# Patient Record
Sex: Male | Born: 1970 | Race: Asian | Hispanic: No | Marital: Married | Smoking: Never smoker
Health system: Southern US, Community
[De-identification: ages and names within clinical notes are randomized; demographics above are authoritative.]

## PROBLEM LIST (undated history)

## (undated) DIAGNOSIS — IMO0002 Reserved for concepts with insufficient information to code with codable children: Secondary | ICD-10-CM

## (undated) DIAGNOSIS — M1990 Unspecified osteoarthritis, unspecified site: Secondary | ICD-10-CM

## (undated) HISTORY — PX: EYE SURGERY: SHX253

## (undated) HISTORY — DX: Reserved for concepts with insufficient information to code with codable children: IMO0002

## (undated) HISTORY — DX: Unspecified osteoarthritis, unspecified site: M19.90

## (undated) HISTORY — PX: VASECTOMY: SHX75

---

## 2003-05-21 ENCOUNTER — Encounter: Admission: RE | Admit: 2003-05-21 | Discharge: 2003-06-25 | Payer: Self-pay | Admitting: Internal Medicine

## 2012-08-19 ENCOUNTER — Ambulatory Visit: Payer: BC Managed Care – PPO

## 2012-08-19 ENCOUNTER — Ambulatory Visit: Payer: BC Managed Care – PPO | Admitting: Family Medicine

## 2012-08-19 VITALS — BP 130/78 | HR 85 | Temp 98.2°F | Resp 16 | Ht 71.0 in | Wt 182.0 lb

## 2012-08-19 DIAGNOSIS — M546 Pain in thoracic spine: Secondary | ICD-10-CM

## 2012-08-19 DIAGNOSIS — E7800 Pure hypercholesterolemia, unspecified: Secondary | ICD-10-CM

## 2012-08-19 LAB — POCT GLYCOSYLATED HEMOGLOBIN (HGB A1C): Hemoglobin A1C: 5.6

## 2012-08-19 NOTE — Progress Notes (Signed)
[facility] [address], [place] [address] [phone]  Date:  08/19/2012   Name:  Chris Byrd   DOB:  12-May-1971   MRN:  [id number]  PCP:  No primary provider on file.    Chief Complaint: Back Pain   History of Present Illness:  Chris Byrd is a 41 y.o. very pleasant male patient who presents with the following:  He is here today to evaluate back pain- he has noted some discomfort for about 3 months. The pain is located in his thoracic area, across the muscles of his back. The pain has not been major- at one point he was taking ibuprofen 3 times a day, but has not done that for the last couple of months.  He currently needs medication just occasionally.    He feels discomfort now mostly with getting out of bed in the am- his back is stiff and sore.  He had a similar problem 8- 10 years ago and did PT which did help.   He does not have any symptoms into his legs, no pain or numbness.  No leg weakness There was no particular inciting injury that he can recall.    He is concerned about arthritis in his neck- it seems we did x-rays a few years ago. (however I cannot actually find any record of x-rays so these may not have been done.)  He sometimes will have some pain or stiffness in his neck as well.    He also had a cholesterol check at his office a month ago- he was fasting and his LDL was 170.  He does not smoke, no DM, no HTN.  His father has a history of DM and high cholesterol.  He would like to have an A1c today as well. He has been working on getting more exercise since he received this LDL report  There is no problem list on file for this patient.   Past Medical History  Diagnosis Date  . Arthritis   . Ulcer     Past Surgical History  Procedure Date  . Eye surgery   . Vasectomy     History  Substance Use Topics  . Smoking status: Never Smoker   . Smokeless tobacco: Not on file  . Alcohol Use: No    Family History  Problem  Relation Age of Onset  . Diabetes Father   . Cancer Maternal Grandmother     Allergies  Allergen Reactions  . Reglan (Metoclopramide)     Medication list has been reviewed and updated.  No current outpatient prescriptions on file prior to visit.    Review of Systems:  As per HPI- otherwise negative.   Physical Examination: Filed Vitals:   08/19/12 1316  BP: 130/78  Pulse: 85  Temp: 98.2 F (36.8 C)  Resp: 16   Filed Vitals:   08/19/12 1316  Height: 5\' 11"  (1.803 m)  Weight: 182 lb (82.555 kg)   Body mass index is 25.38 kg/(m^2). Ideal Body Weight: Weight in (lb) to have BMI = 25: 178.9   GEN: WDWN, NAD, Non-toxic, A & O x 3 HEENT: Atraumatic, Normocephalic. Neck supple. No masses, No LAD. Bilateral TM wnl, oropharynx normal.  PEERL,EOMI.   Ears and Nose: No external deformity. CV: RRR, No M/G/R. No JVD. No thrill. No extra heart sounds. PULM: CTA B, no wheezes, crackles, rhonchi. No retractions. No resp. distress. No accessory muscle use. Back: he is slightly tender over the thoracic paraspinous muscles.  EXTR: No c/c/e NEURO Normal gait.  PSYCH: Normally interactive. Conversant. Not depressed or anxious appearing.  Calm demeanor.   [facility] reading (PRIMARY) by  Dr. Patsy Lager. Thoracic spine and AP chest: negative for any acute abnormalities.  He does have degenerative changes of the T spine  THORACIC SPINE - 2 VIEW  Comparison: None.  Findings: Vertebral body height and alignment are normal. No notable degenerative change. Paraspinous structures unremarkable.  IMPRESSION: Negative exam.  CHEST - 1 VIEW  Comparison: None.  Findings: Lungs clear. Heart size normal. No pneumothorax or pleural fluid. No bony abnormality.  IMPRESSION: Negative study.  Clinically significant discrepancy from primary report, if provided: None    Results for orders placed in visit on 08/19/12  POCT GLYCOSYLATED HEMOGLOBIN (HGB A1C)      Component Value Range    Hemoglobin A1C 5.6      Assessment and Plan: 1. Thoracic back pain  DG Thoracic Spine 2 View, DG Chest 1 View, Ambulatory referral to Physical Therapy  2. Elevated cholesterol  POCT glycosylated hemoglobin (Hb A1C)   [name] would like to do some PT for his back- I will arrange this.  Let me know if not better.  Counseled that his A1c is ok, but we need to keep an eye on it.  He plans to [facility] for a CPE in the next few months to recheck his FLP  Abbe Amsterdam, MD

## 2012-08-19 NOTE — Patient Instructions (Addendum)
Let's plan to do a physical exam/ labs in a couple of months Your A1c does look ok- however, if it gets much higher it could indicate an increased risk of diabetes.  Fortunately, the same changes that will improve your cholesterol will also cut your risk of developing diabetes- exercise, lower fat diet and weight control

## 2014-05-20 ENCOUNTER — Ambulatory Visit (INDEPENDENT_AMBULATORY_CARE_PROVIDER_SITE_OTHER): Payer: BC Managed Care – PPO | Admitting: Family Medicine

## 2014-05-20 VITALS — BP 124/88 | HR 70 | Temp 97.6°F | Ht 70.5 in | Wt 179.6 lb

## 2014-05-20 DIAGNOSIS — M545 Low back pain, unspecified: Secondary | ICD-10-CM

## 2014-05-20 DIAGNOSIS — T148XXA Other injury of unspecified body region, initial encounter: Secondary | ICD-10-CM

## 2014-05-20 DIAGNOSIS — R1011 Right upper quadrant pain: Secondary | ICD-10-CM

## 2014-05-20 LAB — COMPREHENSIVE METABOLIC PANEL
ALT: 51 U/L (ref 0–53)
Albumin: 4.9 g/dL (ref 3.5–5.2)
CO2: 27 mEq/L (ref 19–32)
Calcium: 9.4 mg/dL (ref 8.4–10.5)
Chloride: 103 mEq/L (ref 96–112)
Creat: 0.95 mg/dL (ref 0.50–1.35)
Potassium: 4.1 mEq/L (ref 3.5–5.3)
Total Protein: 8 g/dL (ref 6.0–8.3)

## 2014-05-20 LAB — COMPREHENSIVE METABOLIC PANEL WITH GFR
AST: 26 U/L (ref 0–37)
Alkaline Phosphatase: 83 U/L (ref 39–117)
BUN: 8 mg/dL (ref 6–23)
Glucose, Bld: 92 mg/dL (ref 70–99)
Sodium: 139 meq/L (ref 135–145)
Total Bilirubin: 0.7 mg/dL (ref 0.2–1.2)

## 2014-05-20 MED ORDER — CYCLOBENZAPRINE HCL 5 MG PO TABS
5.0000 mg | ORAL_TABLET | Freq: Three times a day (TID) | ORAL | Status: AC | PRN
Start: 2014-05-20 — End: ?

## 2014-05-20 MED ORDER — MELOXICAM 7.5 MG PO TABS: 7.5000 mg | ORAL_TABLET | Freq: Two times a day (BID) | ORAL | Status: AC | PRN

## 2014-05-20 NOTE — Progress Notes (Signed)
Chief Complaint:  Chief Complaint  Patient presents with  . Back Pain    severe pain for 2 days -NKI-    HPI: Chris Byrd is a 43 y.o. male who is here for  Low back pain for the last 4 days, taking 400 mg ibuprofen without relief, he has upper and lower back problems in the past. He has to stay upright. He has problems like tis before and it went away in a few days. THis one is lingering longer. He has no numbness or tingling. No radiation. He is concentrated on the small of his back, slightly to the left . He has no incontinence.   He deneis coughing, SOB, hemoptysis, CP, unintentional weight loss, no history of TB. He lifts weights but nothing different in the last several days. NKI, no triggers that he is aware of . 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 He has a remote h/o ulcer  Past Medical History  Diagnosis Date  . Arthritis   . Ulcer    Past Surgical History  Procedure Laterality Date  . Eye surgery    . Vasectomy     History   Social History  . Marital Status: Married    Spouse Name: N/A    Number of Children: N/A  . Years of Education: N/A   Social History Main Topics  . Smoking status: Never Smoker   . Smokeless tobacco: None  . Alcohol Use: No  . Drug Use: No  . Sexual Activity: Yes   Other Topics Concern  . None   Social History Narrative  . None   Family History  Problem Relation Age of Onset  . Diabetes Father   . Cancer Maternal Grandmother    Allergies  Allergen Reactions  . Reglan [Metoclopramide]    Prior to Admission medications   Medication Sig Start Date End Date Taking? Authorizing Provider  ibuprofen (ADVIL,MOTRIN) 200 MG tablet Take by mouth 3 (three) times daily.   Yes Historical Provider, MD     ROS: The patient denies fevers, chills, night sweats, unintentional weight loss, chest pain, palpitations, wheezing, dyspnea on exertion, nausea, vomiting, abdominal pain, dysuria, hematuria, melena,  numbness, weakness, or tingling.  All other systems have been reviewed and were otherwise negative with the exception of those mentioned in the HPI and as above.    PHYSICAL EXAM: Filed Vitals:   05/20/14 1030  BP: 124/88  Pulse: 70  Temp: 97.6 F (36.4 C)   Filed Vitals:   05/20/14 1030  Height: 5' 10.5" (1.791 m)  Weight: 179 lb 9.6 oz (81.466 kg)   Body mass index is 25.4 kg/(m^2).  General: Alert, no acute distress HEENT:  Normocephalic, atraumatic, oropharynx patent. EOMI, PERRLA Cardiovascular:  Regular rate and rhythm, no rubs murmurs or gallops.  No Carotid bruits, radial pulse intact. No pedal edema.  Respiratory: Clear to auscultation bilaterally.  No wheezes, rales, or rhonchi.  No cyanosis, no use of accessory musculature GI: No organomegaly, abdomen is soft and non-tender, positive bowel sounds.  No masses. Skin: No rashes. Neurologic: Facial musculature symmetric. Psychiatric: Patient is appropriate throughout our interaction. Lymphatic: No cervical lymphadenopathy Musculoskeletal: Gait intact. + paramsk tenderness  Full ROM 5/5 strength, 2/2 DTRs No saddle anesthesia Straight leg negative Hip and knee exam--normal    LABS: Results for orders placed in visit on 08/19/12  POCT GLYCOSYLATED HEMOGLOBIN (HGB A1C)      Result Value Ref Range  Hemoglobin A1C 5.6       EKG/XRAY:   Primary read interpreted by Dr. Conley Rolls at [facility].   ASSESSMENT/PLAN: Encounter Diagnoses  Name Primary?  . Left-sided low back pain without sciatica Yes  . Sprain and strain   . RUQ abdominal pain    43 y.o male who came in for low back pain, then when I pressed on his RUQ he states he was tender, he has no other sxs  So we decided to just do a CMP to make sure liver enzymes are ok Rx flexeril Rx mobic ( precautions given for h.o ulcer , he ahs taken NSAIDs without problems, he declines any stronger meds, will call me if he wants tramadol)  I think his back pain is all msk in  origin without sciatica, back exrcises given, he can return for xrays if worsening sxs, patient was agreeable with this plan.  F/u prn  Gross sideeffects, risk and benefits, and alternatives of medications d/w patient. Patient is aware that all medications have potential sideeffects and we are unable to predict every sideeffect or drug-drug interaction that may occur.  Amani Nodarse PHUONG, DO 05/20/2014 11:21 AM

## 2014-05-20 NOTE — Patient Instructions (Signed)
Low Back Sprain with Rehab  A sprain is an injury in which a ligament is torn. The ligaments of the lower back are vulnerable to sprains. However, they are strong and require great force to be injured. These ligaments are important for stabilizing the spinal column. Sprains are classified into three categories. Grade 1 sprains cause pain, but the tendon is not lengthened. Grade 2 sprains include a lengthened ligament, due to the ligament being stretched or partially ruptured. With grade 2 sprains there is still function, although the function may be decreased. Grade 3 sprains involve a complete tear of the tendon or muscle, and function is usually impaired. SYMPTOMS   Severe pain in the lower back.  Sometimes, a feeling of a "pop," "snap," or tear, at the time of injury.  Tenderness and sometimes swelling at the injury site.  Uncommonly, bruising (contusion) within 48 hours of injury.  Muscle spasms in the back. CAUSES  Low back sprains occur when a force is placed on the ligaments that is greater than they can handle. Common causes of injury include:  Performing a stressful act while off-balance.  Repetitive stressful activities that involve movement of the lower back.  Direct hit (trauma) to the lower back. RISK INCREASES WITH:  Contact sports (football, wrestling).  Collisions (major skiing accidents).  Sports that require throwing or lifting (baseball, weightlifting).  Sports involving twisting of the spine (gymnastics, diving, tennis, golf).  Poor strength and flexibility.  Inadequate protection.  Previous back injury or surgery (especially fusion). PREVENTION  Wear properly fitted and padded protective equipment.  Warm up and stretch properly before activity.  Allow for adequate recovery between workouts.  Maintain physical fitness:  Strength, flexibility, and endurance.  Cardiovascular fitness.  Maintain a healthy body weight. PROGNOSIS  If treated  properly, low back sprains usually heal with non-surgical treatment. The length of time for healing depends on the severity of the injury.  RELATED COMPLICATIONS   Recurring symptoms, resulting in a chronic problem.  Chronic inflammation and pain in the low back.  Delayed healing or resolution of symptoms, especially if activity is resumed too soon.  Prolonged impairment.  Unstable or arthritic joints of the low back. TREATMENT  Treatment first involves the use of ice and medicine, to reduce pain and inflammation. The use of strengthening and stretching exercises may help reduce pain with activity. These exercises may be performed at home or with a therapist. Severe injuries may require referral to a therapist for further evaluation and treatment, such as ultrasound. Your caregiver may advise that you wear a back brace or corset, to help reduce pain and discomfort. Often, prolonged bed rest results in greater harm then benefit. Corticosteroid injections may be recommended. However, these should be reserved for the most serious cases. It is important to avoid using your back when lifting objects. At night, sleep on your back on a firm mattress, with a pillow placed under your knees. If non-surgical treatment is unsuccessful, surgery may be needed.  MEDICATION   If pain medicine is needed, nonsteroidal anti-inflammatory medicines (aspirin and ibuprofen), or other minor pain relievers (acetaminophen), are often advised.  Do not take pain medicine for 7 days before surgery.  Prescription pain relievers may be given, if your caregiver thinks they are needed. Use only as directed and only as much as you need.  Ointments applied to the skin may be helpful.  Corticosteroid injections may be given by your caregiver. These injections should be reserved for the most serious cases,   because they may only be given a certain number of times. HEAT AND COLD  Cold treatment (icing) should be applied for 10  to 15 minutes every 2 to 3 hours for inflammation and pain, and immediately after activity that aggravates your symptoms. Use ice packs or an ice massage.  Heat treatment may be used before performing stretching and strengthening activities prescribed by your caregiver, physical therapist, or athletic trainer. Use a heat pack or a warm water soak. SEEK MEDICAL CARE IF:   Symptoms get worse or do not improve in 2 to 4 weeks, despite treatment.  You develop numbness or weakness in either leg.  You lose bowel or bladder function.  Any of the following occur after surgery: fever, increased pain, swelling, redness, drainage of fluids, or bleeding in the affected area.  New, unexplained symptoms develop. (Drugs used in treatment may produce side effects.) EXERCISES  RANGE OF MOTION (ROM) AND STRETCHING EXERCISES - Low Back Sprain Most people with lower back pain will find that their symptoms get worse with excessive bending forward (flexion) or arching at the lower back (extension). The exercises that will help resolve your symptoms will focus on the opposite motion.  Your physician, physical therapist or athletic trainer will help you determine which exercises will be most helpful to resolve your lower back pain. Do not complete any exercises without first consulting with your caregiver. Discontinue any exercises which make your symptoms worse, until you speak to your caregiver. If you have pain, numbness or tingling which travels down into your buttocks, leg or foot, the goal of the therapy is for these symptoms to move closer to your back and eventually resolve. Sometimes, these leg symptoms will get better, but your lower back pain may worsen. This is often an indication of progress in your rehabilitation. Be very alert to any changes in your symptoms and the activities in which you participated in the 24 hours prior to the change. Sharing this information with your caregiver will allow him or her to  most efficiently treat your condition. These exercises may help you when beginning to rehabilitate your injury. Your symptoms may resolve with or without further involvement from your physician, physical therapist or athletic trainer. While completing these exercises, remember:   Restoring tissue flexibility helps normal motion to return to the joints. This allows healthier, less painful movement and activity.  An effective stretch should be held for at least 30 seconds.  A stretch should never be painful. You should only feel a gentle lengthening or release in the stretched tissue. FLEXION RANGE OF MOTION AND STRETCHING EXERCISES: STRETCH - Flexion, Single Knee to Chest   Lie on a firm bed or floor with both legs extended in front of you.  Keeping one leg in contact with the floor, bring your opposite knee to your chest. Hold your leg in place by either grabbing behind your thigh or at your knee.  Pull until you feel a gentle stretch in your low back. Hold __________ seconds.  Slowly release your grasp and repeat the exercise with the opposite side. Repeat __________ times. Complete this exercise __________ times per day.  STRETCH - Flexion, Double Knee to Chest  Lie on a firm bed or floor with both legs extended in front of you.  Keeping one leg in contact with the floor, bring your opposite knee to your chest.  Tense your stomach muscles to support your back and then lift your other knee to your chest. Hold your legs   in place by either grabbing behind your thighs or at your knees.  Pull both knees toward your chest until you feel a gentle stretch in your low back. Hold __________ seconds.  Tense your stomach muscles and slowly return one leg at a time to the floor. Repeat __________ times. Complete this exercise __________ times per day.  STRETCH - Low Trunk Rotation  Lie on a firm bed or floor. Keeping your legs in front of you, bend your knees so they are both pointed toward the  ceiling and your feet are flat on the floor.  Extend your arms out to the side. This will stabilize your upper body by keeping your shoulders in contact with the floor.  Gently and slowly drop both knees together to one side until you feel a gentle stretch in your low back. Hold for __________ seconds.  Tense your stomach muscles to support your lower back as you bring your knees back to the starting position. Repeat the exercise to the other side. Repeat __________ times. Complete this exercise __________ times per day  EXTENSION RANGE OF MOTION AND FLEXIBILITY EXERCISES: STRETCH - Extension, Prone on Elbows   Lie on your stomach on the floor, a bed will be too soft. Place your palms about shoulder width apart and at the height of your head.  Place your elbows under your shoulders. If this is too painful, stack pillows under your chest.  Allow your body to relax so that your hips drop lower and make contact more completely with the floor.  Hold this position for __________ seconds.  Slowly return to lying flat on the floor. Repeat __________ times. Complete this exercise __________ times per day.  RANGE OF MOTION - Extension, Prone Press Ups  Lie on your stomach on the floor, a bed will be too soft. Place your palms about shoulder width apart and at the height of your head.  Keeping your back as relaxed as possible, slowly straighten your elbows while keeping your hips on the floor. You may adjust the placement of your hands to maximize your comfort. As you gain motion, your hands will come more underneath your shoulders.  Hold this position __________ seconds.  Slowly return to lying flat on the floor. Repeat __________ times. Complete this exercise __________ times per day.  RANGE OF MOTION- Quadruped, Neutral Spine   Assume a hands and knees position on a firm surface. Keep your hands under your shoulders and your knees under your hips. You may place padding under your knees for  comfort.  Drop your head and point your tailbone toward the ground below you. This will round out your lower back like an angry cat. Hold this position for __________ seconds.  Slowly lift your head and release your tail bone so that your back sags into a large arch, like an old horse.  Hold this position for __________ seconds.  Repeat this until you feel limber in your low back.  Now, find your "sweet spot." This will be the most comfortable position somewhere between the two previous positions. This is your neutral spine. Once you have found this position, tense your stomach muscles to support your low back.  Hold this position for __________ seconds. Repeat __________ times. Complete this exercise __________ times per day.  STRENGTHENING EXERCISES - Low Back Sprain These exercises may help you when beginning to rehabilitate your injury. These exercises should be done near your "sweet spot." This is the neutral, low-back arch, somewhere between fully rounded   and fully arched, that is your least painful position. When performed in this safe range of motion, these exercises can be used for people who have either a flexion or extension based injury. These exercises may resolve your symptoms with or without further involvement from your physician, physical therapist or athletic trainer. While completing these exercises, remember:   Muscles can gain both the endurance and the strength needed for everyday activities through controlled exercises.  Complete these exercises as instructed by your physician, physical therapist or athletic trainer. Increase the resistance and repetitions only as guided.  You may experience muscle soreness or fatigue, but the pain or discomfort you are trying to eliminate should never worsen during these exercises. If this pain does worsen, stop and make certain you are following the directions exactly. If the pain is still present after adjustments, discontinue the  exercise until you can discuss the trouble with your caregiver. STRENGTHENING - Deep Abdominals, Pelvic Tilt   Lie on a firm bed or floor. Keeping your legs in front of you, bend your knees so they are both pointed toward the ceiling and your feet are flat on the floor.  Tense your lower abdominal muscles to press your low back into the floor. This motion will rotate your pelvis so that your tail bone is scooping upwards rather than pointing at your feet or into the floor. With a gentle tension and even breathing, hold this position for __________ seconds. Repeat __________ times. Complete this exercise __________ times per day.  STRENGTHENING - Abdominals, Crunches   Lie on a firm bed or floor. Keeping your legs in front of you, bend your knees so they are both pointed toward the ceiling and your feet are flat on the floor. Cross your arms over your chest.  Slightly tip your chin down without bending your neck.  Tense your abdominals and slowly lift your trunk high enough to just clear your shoulder blades. Lifting higher can put excessive stress on the lower back and does not further strengthen your abdominal muscles.  Control your return to the starting position. Repeat __________ times. Complete this exercise __________ times per day.  STRENGTHENING - Quadruped, Opposite UE/LE Lift   Assume a hands and knees position on a firm surface. Keep your hands under your shoulders and your knees under your hips. You may place padding under your knees for comfort.  Find your neutral spine and gently tense your abdominal muscles so that you can maintain this position. Your shoulders and hips should form a rectangle that is parallel with the floor and is not twisted.  Keeping your trunk steady, lift your right hand no higher than your shoulder and then your left leg no higher than your hip. Make sure you are not holding your breath. Hold this position for __________ seconds.  Continuing to keep  your abdominal muscles tense and your back steady, slowly return to your starting position. Repeat with the opposite arm and leg. Repeat __________ times. Complete this exercise __________ times per day.  STRENGTHENING - Abdominals and Quadriceps, Straight Leg Raise   Lie on a firm bed or floor with both legs extended in front of you.  Keeping one leg in contact with the floor, bend the other knee so that your foot can rest flat on the floor.  Find your neutral spine, and tense your abdominal muscles to maintain your spinal position throughout the exercise.  Slowly lift your straight leg off the floor about 6 inches for a count   of 15, making sure to not hold your breath.  Still keeping your neutral spine, slowly lower your leg all the way to the floor. Repeat this exercise with each leg __________ times. Complete this exercise __________ times per day. POSTURE AND BODY MECHANICS CONSIDERATIONS - Low Back Sprain Keeping correct posture when sitting, standing or completing your activities will reduce the stress put on different body tissues, allowing injured tissues a chance to heal and limiting painful experiences. The following are general guidelines for improved posture. Your physician or physical therapist will provide you with any instructions specific to your needs. While reading these guidelines, remember:  The exercises prescribed by your provider will help you have the flexibility and strength to maintain correct postures.  The correct posture provides the best environment for your joints to work. All of your joints have less wear and tear when properly supported by a spine with good posture. This means you will experience a healthier, less painful body.  Correct posture must be practiced with all of your activities, especially prolonged sitting and standing. Correct posture is as important when doing repetitive low-stress activities (typing) as it is when doing a single heavy-load  activity (lifting). RESTING POSITIONS Consider which positions are most painful for you when choosing a resting position. If you have pain with flexion-based activities (sitting, bending, stooping, squatting), choose a position that allows you to rest in a less flexed posture. You would want to avoid curling into a fetal position on your side. If your pain worsens with extension-based activities (prolonged standing, working overhead), avoid resting in an extended position such as sleeping on your stomach. Most people will find more comfort when they rest with their spine in a more neutral position, neither too rounded nor too arched. Lying on a non-sagging bed on your side with a pillow between your knees, or on your back with a pillow under your knees will often provide some relief. Keep in mind, being in any one position for a prolonged period of time, no matter how correct your posture, can still lead to stiffness. PROPER SITTING POSTURE In order to minimize stress and discomfort on your spine, you must sit with correct posture. Sitting with good posture should be effortless for a healthy body. Returning to good posture is a gradual process. Many people can work toward this most comfortably by using various supports until they have the flexibility and strength to maintain this posture on their own. When sitting with proper posture, your ears will fall over your shoulders and your shoulders will fall over your hips. You should use the back of the chair to support your upper back. Your lower back will be in a neutral position, just slightly arched. You may place a small pillow or folded towel at the base of your lower back for  support.  When working at a desk, create an environment that supports good, upright posture. Without extra support, muscles tire, which leads to excessive strain on joints and other tissues. Keep these recommendations in mind: CHAIR:  A chair should be able to slide under your desk  when your back makes contact with the back of the chair. This allows you to work closely.  The chair's height should allow your eyes to be level with the upper part of your monitor and your hands to be slightly lower than your elbows. BODY POSITION  Your feet should make contact with the floor. If this is not possible, use a foot rest.  Keep your   ears over your shoulders. This will reduce stress on your neck and low back. INCORRECT SITTING POSTURES  If you are feeling tired and unable to assume a healthy sitting posture, do not slouch or slump. This puts excessive strain on your back tissues, causing more damage and pain. Healthier options include:  Using more support, like a lumbar pillow.  Switching tasks to something that requires you to be upright or walking.  Talking a brief walk.  Lying down to rest in a neutral-spine position. PROLONGED STANDING WHILE SLIGHTLY LEANING FORWARD  When completing a task that requires you to lean forward while standing in one place for a long time, place either foot up on a stationary 2-4 inch high object to help maintain the best posture. When both feet are on the ground, the lower back tends to lose its slight inward curve. If this curve flattens (or becomes too large), then the back and your other joints will experience too much stress, tire more quickly, and can cause pain. CORRECT STANDING POSTURES Proper standing posture should be assumed with all daily activities, even if they only take a few moments, like when brushing your teeth. As in sitting, your ears should fall over your shoulders and your shoulders should fall over your hips. You should keep a slight tension in your abdominal muscles to brace your spine. Your tailbone should point down to the ground, not behind your body, resulting in an over-extended swayback posture.  INCORRECT STANDING POSTURES  Common incorrect standing postures include a forward head, locked knees and/or an excessive  swayback. WALKING Walk with an upright posture. Your ears, shoulders and hips should all line-up. PROLONGED ACTIVITY IN A FLEXED POSITION When completing a task that requires you to bend forward at your waist or lean over a low surface, try to find a way to stabilize 3 out of 4 of your limbs. You can place a hand or elbow on your thigh or rest a knee on the surface you are reaching across. This will provide you more stability, so that your muscles do not tire as quickly. By keeping your knees relaxed, or slightly bent, you will also reduce stress across your lower back. CORRECT LIFTING TECHNIQUES DO :  Assume a wide stance. This will provide you more stability and the opportunity to get as close as possible to the object which you are lifting.  Tense your abdominals to brace your spine. Bend at the knees and hips. Keeping your back locked in a neutral-spine position, lift using your leg muscles. Lift with your legs, keeping your back straight.  Test the weight of unknown objects before attempting to lift them.  Try to keep your elbows locked down at your sides in order get the best strength from your shoulders when carrying an object.  Always ask for help when lifting heavy or awkward objects. INCORRECT LIFTING TECHNIQUES DO NOT:   Lock your knees when lifting, even if it is a small object.  Bend and twist. Pivot at your feet or move your feet when needing to change directions.  Assume that you can safely pick up even a paperclip without proper posture. Document Released: 09/12/2005 Document Revised: 12/05/2011 Document Reviewed: 12/25/2008 [employer] Patient Information 2015 [employer]. This information is not intended to replace advice given to you by your health care provider. Make sure you discuss any questions you have with your health care provider.  

## 2014-11-24 IMAGING — CR DG CHEST 1V
1 series · 1 of 1 positions shown · non-contrast
Comparison: None.

CLINICAL DATA: Pain.

CHEST - 1 VIEW

[PA]
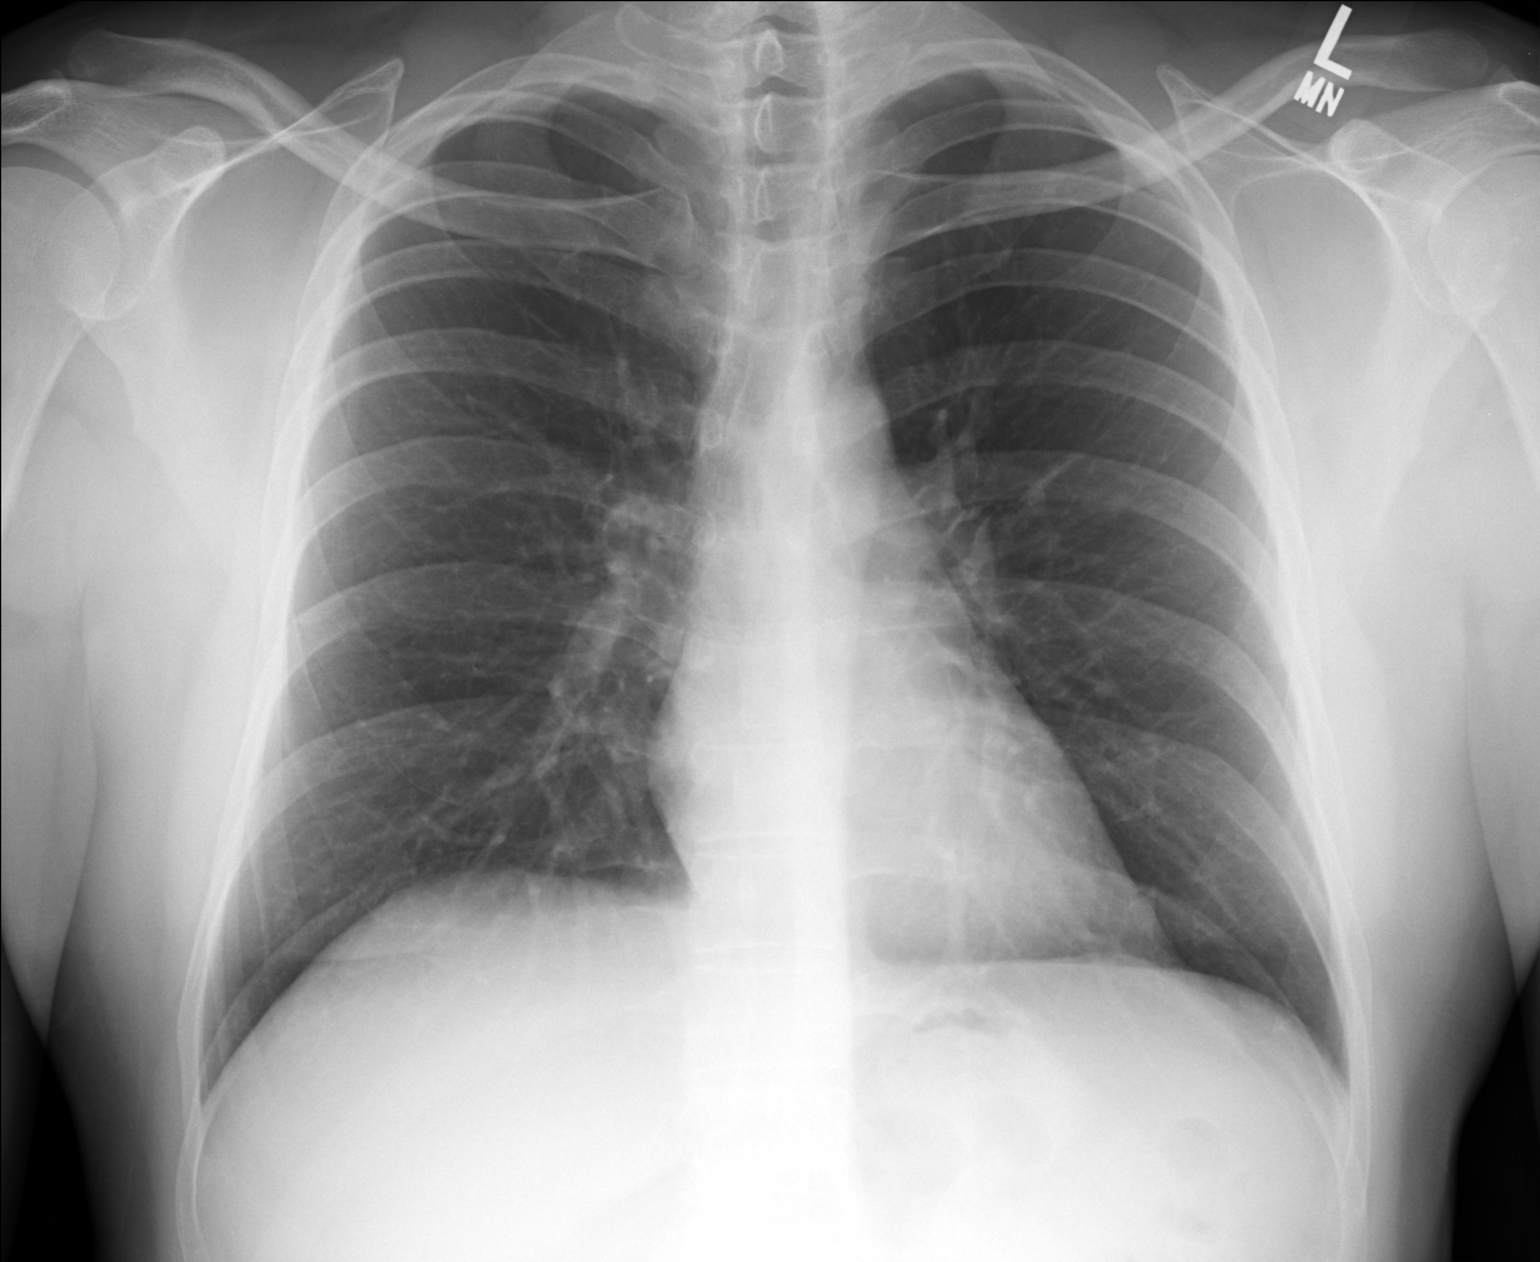

[1 of 1 positions shown; findings below may reference images not displayed]

FINDINGS: Lungs clear.  Heart size normal.  No pneumothorax or
pleural fluid.  No bony abnormality.
IMPRESSION: Negative study.

Clinically significant discrepancy from primary report, if
provided: None

## 2014-11-24 IMAGING — CR DG THORACIC SPINE 2V
3 series · 3 of 3 positions shown · non-contrast
Comparison: None.

CLINICAL DATA: Mid and low back pain.

THORACIC SPINE - 2 VIEW

[AP]
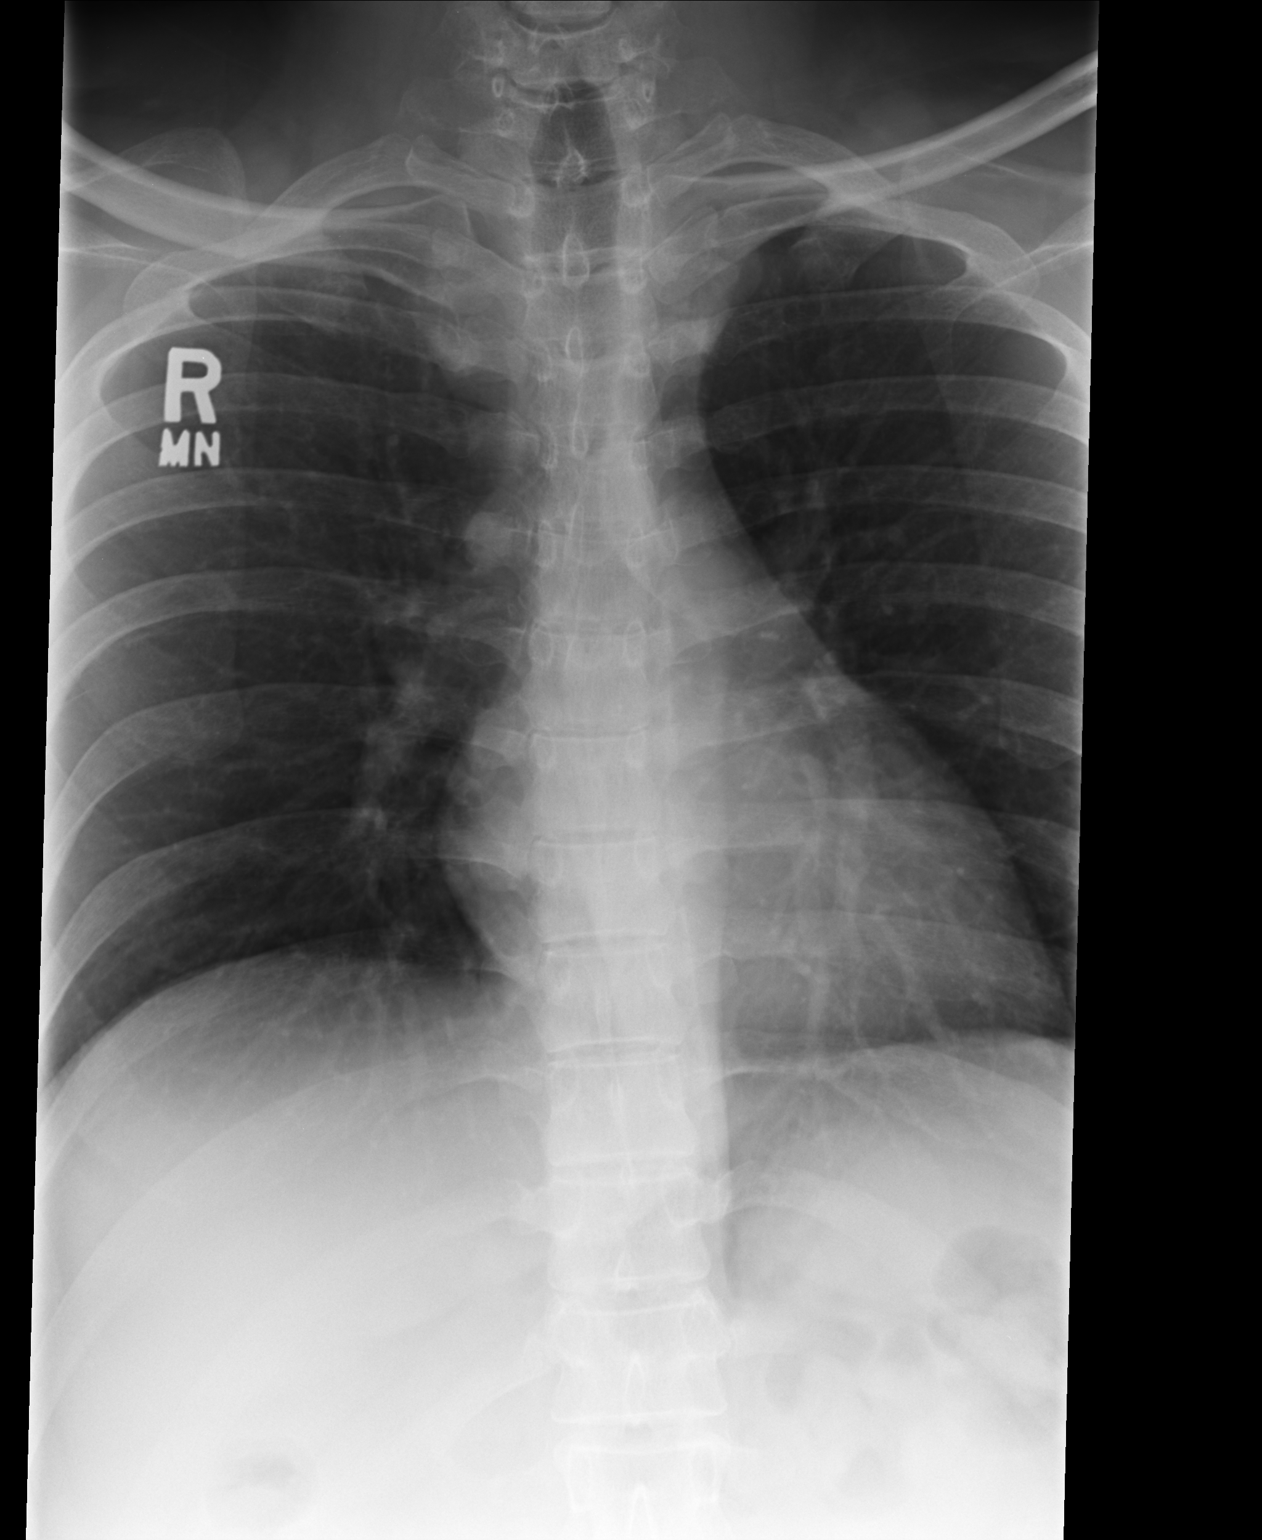

[lateral]
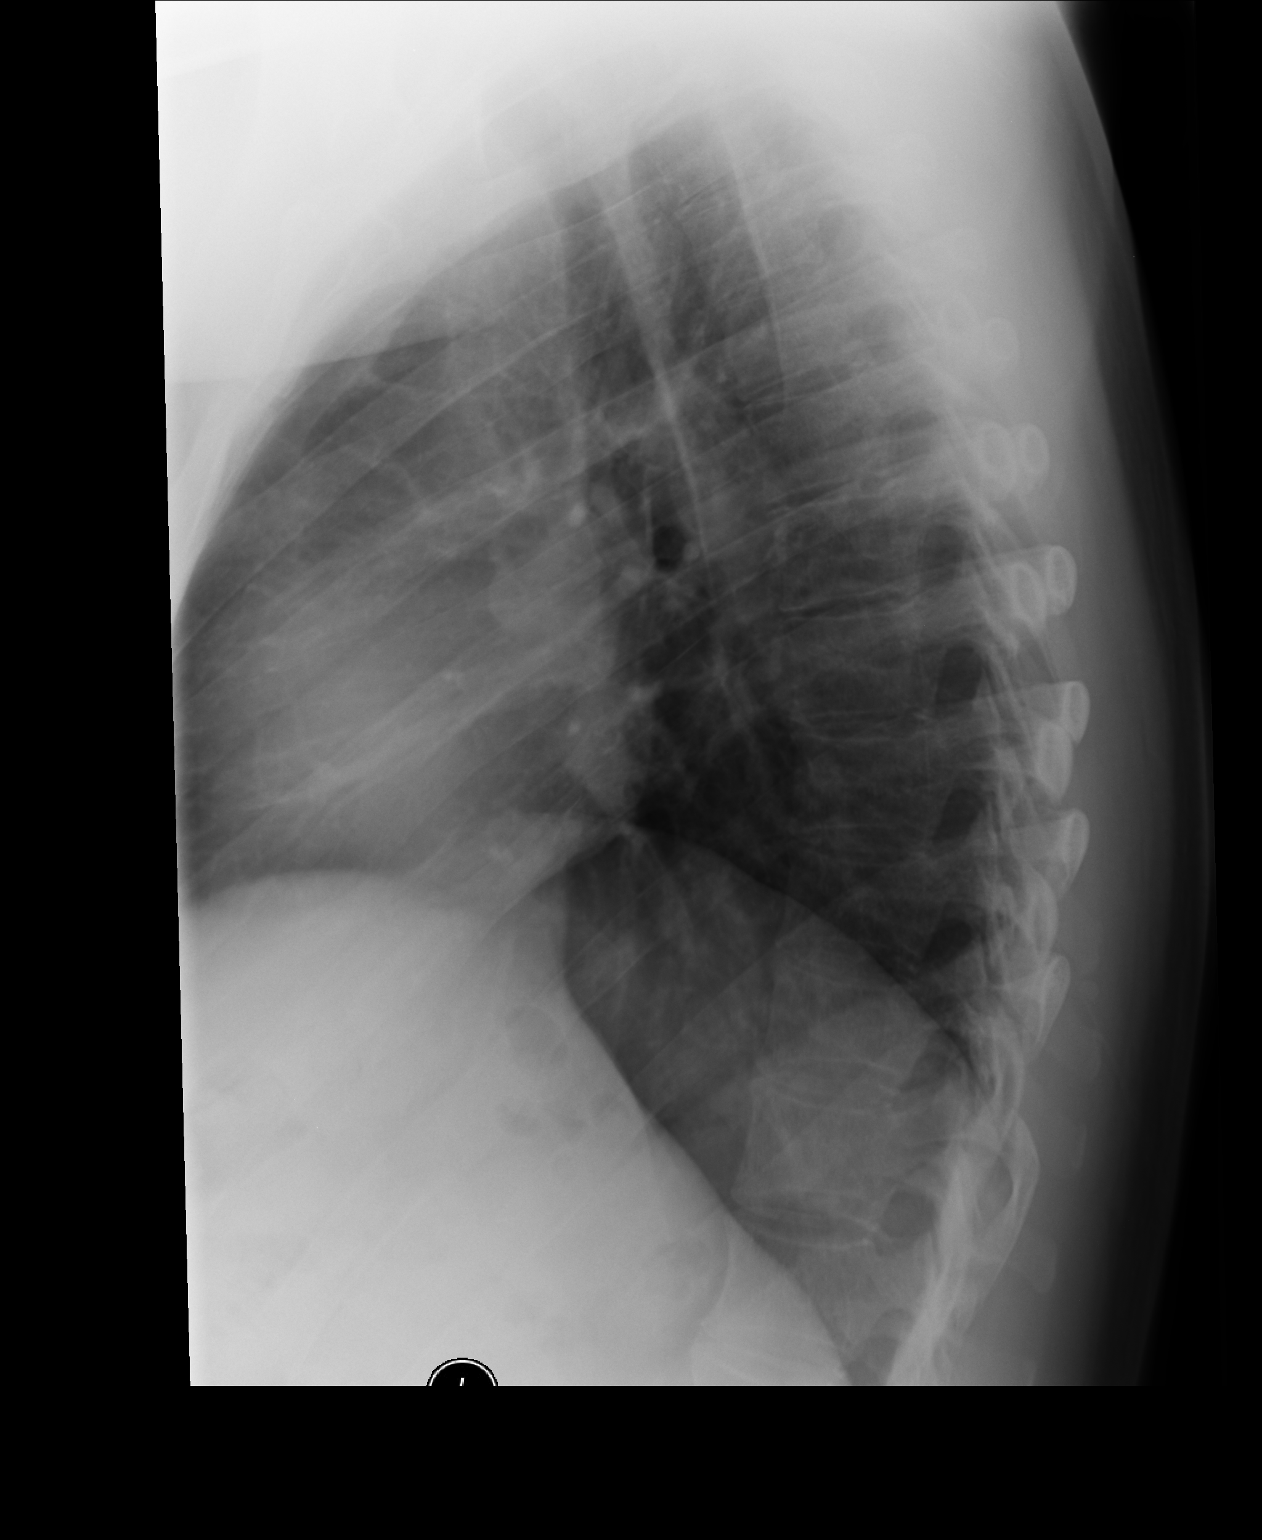

[swimmers]
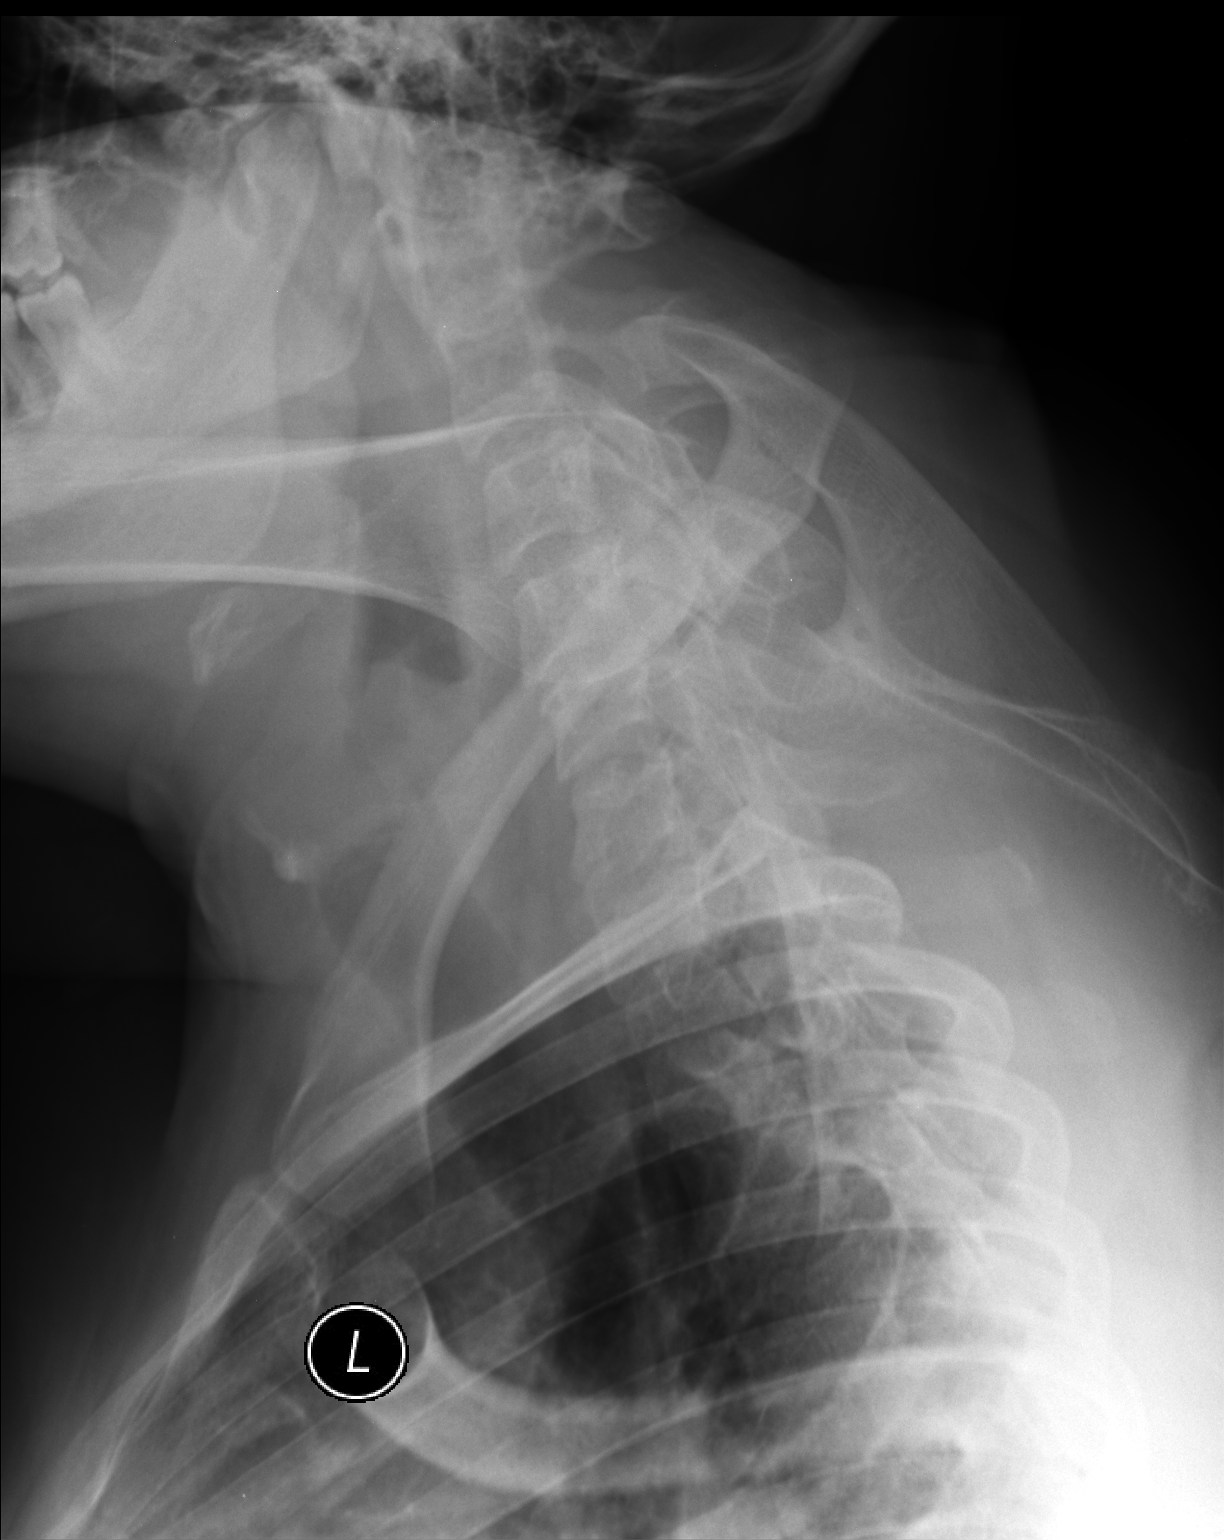

[3 of 3 positions shown; findings below may reference images not displayed]

FINDINGS: Vertebral body height and alignment are normal.  No
notable degenerative change.  Paraspinous structures unremarkable.
IMPRESSION: Negative exam.
